# Patient Record
Sex: Male | Born: 1950 | Race: White | Hispanic: No | Marital: Married | Smoking: Former smoker
Health system: Southern US, Community
[De-identification: ages and names within clinical notes are randomized; demographics above are authoritative.]

## PROBLEM LIST (undated history)

## (undated) DIAGNOSIS — M1990 Unspecified osteoarthritis, unspecified site: Secondary | ICD-10-CM

## (undated) DIAGNOSIS — S0990XA Unspecified injury of head, initial encounter: Secondary | ICD-10-CM

## (undated) DIAGNOSIS — F32A Depression, unspecified: Secondary | ICD-10-CM

## (undated) HISTORY — PX: FOOT SURGERY: SHX648

## (undated) HISTORY — PX: BACK SURGERY: SHX140

## (undated) HISTORY — PX: FACIAL RECONSTRUCTION SURGERY: SHX631

---

## 2004-01-05 ENCOUNTER — Other Ambulatory Visit: Payer: Self-pay

## 2004-02-10 ENCOUNTER — Encounter: Admission: RE | Admit: 2004-02-10 | Discharge: 2004-05-10 | Payer: Self-pay | Admitting: Internal Medicine

## 2005-01-26 ENCOUNTER — Ambulatory Visit: Payer: Self-pay | Admitting: Gastroenterology

## 2005-03-20 ENCOUNTER — Emergency Department (HOSPITAL_COMMUNITY): Admission: EM | Admit: 2005-03-20 | Discharge: 2005-03-20 | Payer: Self-pay | Admitting: Emergency Medicine

## 2005-04-01 ENCOUNTER — Ambulatory Visit: Payer: Self-pay | Admitting: Internal Medicine

## 2006-02-10 ENCOUNTER — Encounter: Payer: Self-pay | Admitting: Otolaryngology

## 2006-03-03 ENCOUNTER — Ambulatory Visit: Payer: Self-pay | Admitting: Internal Medicine

## 2006-03-13 ENCOUNTER — Encounter: Payer: Self-pay | Admitting: Otolaryngology

## 2006-04-12 ENCOUNTER — Encounter: Payer: Self-pay | Admitting: Otolaryngology

## 2006-07-11 ENCOUNTER — Ambulatory Visit (HOSPITAL_COMMUNITY): Admission: RE | Admit: 2006-07-11 | Discharge: 2006-07-12 | Payer: Self-pay | Admitting: Neurosurgery

## 2006-08-23 ENCOUNTER — Encounter: Payer: Self-pay | Admitting: Neurosurgery

## 2006-09-12 ENCOUNTER — Encounter: Payer: Self-pay | Admitting: Neurosurgery

## 2006-09-19 ENCOUNTER — Ambulatory Visit: Payer: Self-pay | Admitting: Neurosurgery

## 2006-10-13 ENCOUNTER — Encounter: Payer: Self-pay | Admitting: Neurosurgery

## 2006-11-12 ENCOUNTER — Encounter: Payer: Self-pay | Admitting: Neurosurgery

## 2006-12-13 ENCOUNTER — Encounter: Payer: Self-pay | Admitting: Neurosurgery

## 2007-02-16 ENCOUNTER — Ambulatory Visit: Payer: Self-pay | Admitting: Otolaryngology

## 2007-03-30 ENCOUNTER — Ambulatory Visit: Payer: Self-pay | Admitting: Otolaryngology

## 2007-04-30 ENCOUNTER — Ambulatory Visit: Payer: Self-pay | Admitting: Otolaryngology

## 2007-07-27 ENCOUNTER — Encounter: Admission: RE | Admit: 2007-07-27 | Discharge: 2007-07-27 | Payer: Self-pay | Admitting: Family Medicine

## 2008-07-11 ENCOUNTER — Ambulatory Visit: Payer: Self-pay | Admitting: Unknown Physician Specialty

## 2009-12-30 ENCOUNTER — Ambulatory Visit: Payer: Self-pay | Admitting: Internal Medicine

## 2010-02-02 ENCOUNTER — Ambulatory Visit: Payer: Self-pay | Admitting: General Surgery

## 2010-02-09 ENCOUNTER — Ambulatory Visit: Payer: Self-pay | Admitting: General Surgery

## 2010-04-23 IMAGING — US ABDOMEN ULTRASOUND
1 series · 17 of 25 positions shown · non-contrast
Comparison: none

REASON FOR EXAM: abd pain generalized
COMMENTS:

[Series 1: abdomen ultrasound · 17 of 63 slices shown]
[im 1/63]
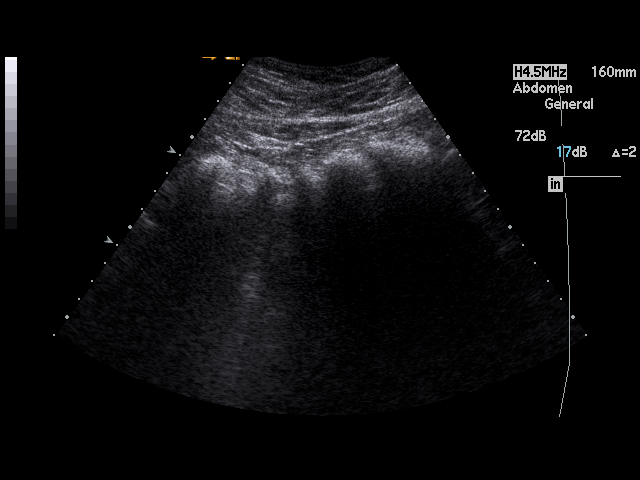
[im 6/63]
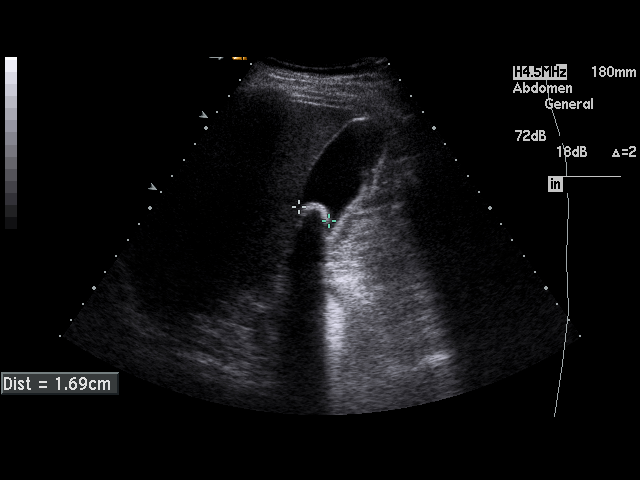
[im 8/63]
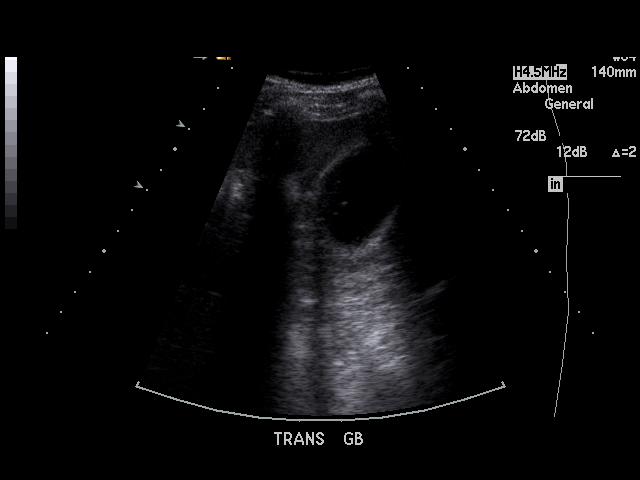
[im 13/63]
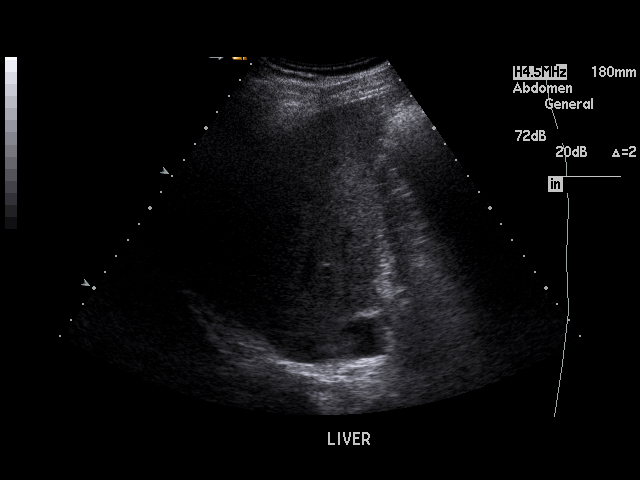
[im 16/63]
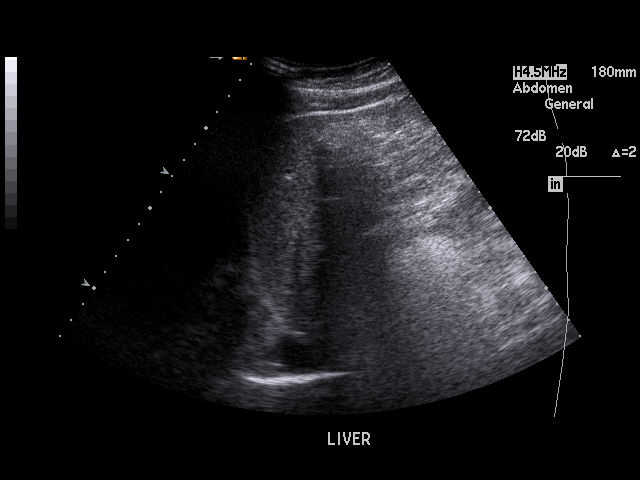
[im 21/63]
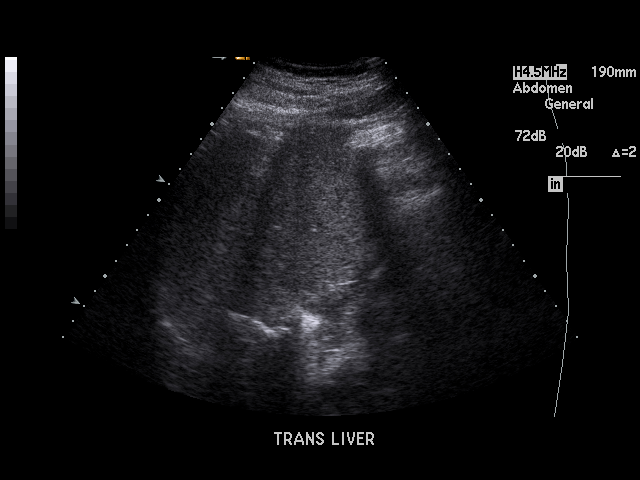
[im 24/63]
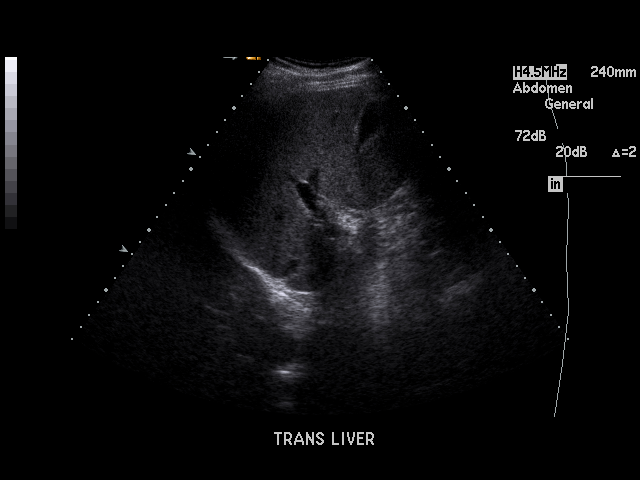
[im 29/63]
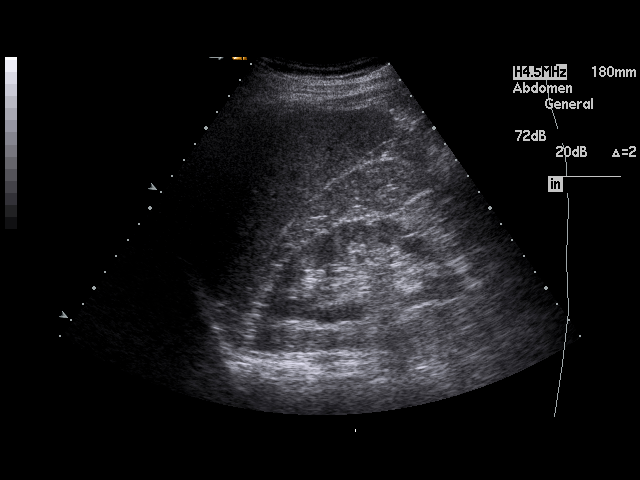
[im 32/63]
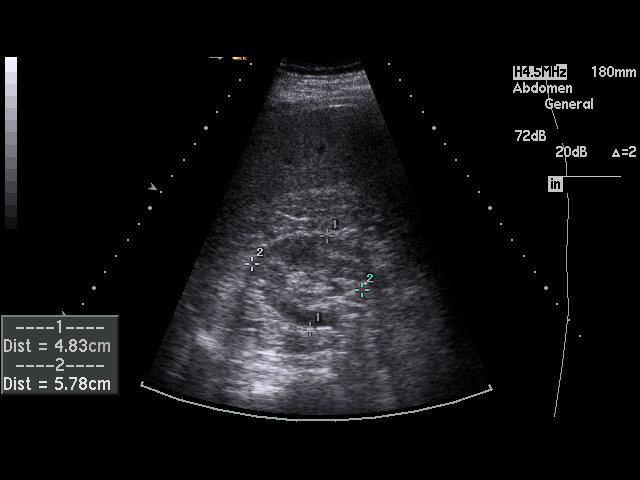
[im 34/63]
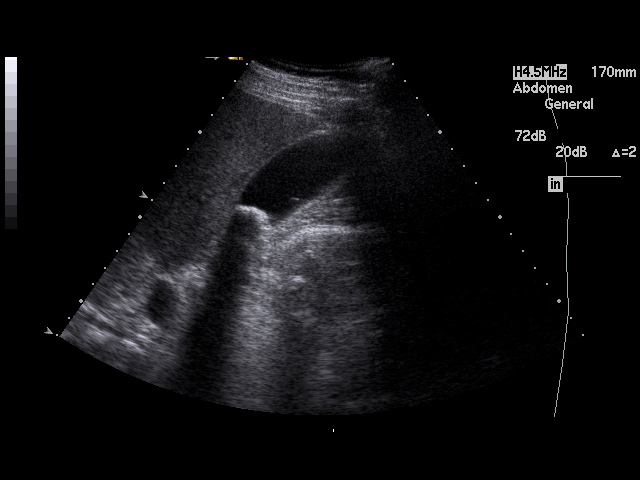
[im 39/63]
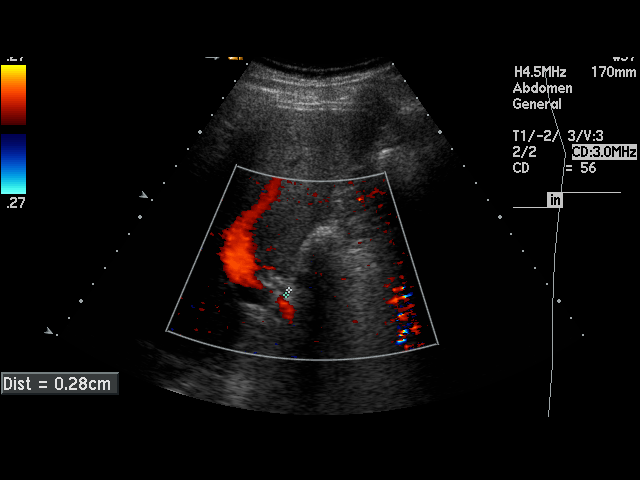
[im 42/63]
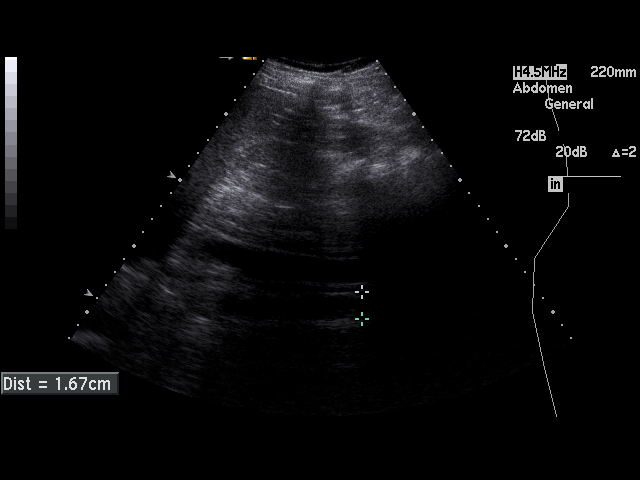
[im 47/63]
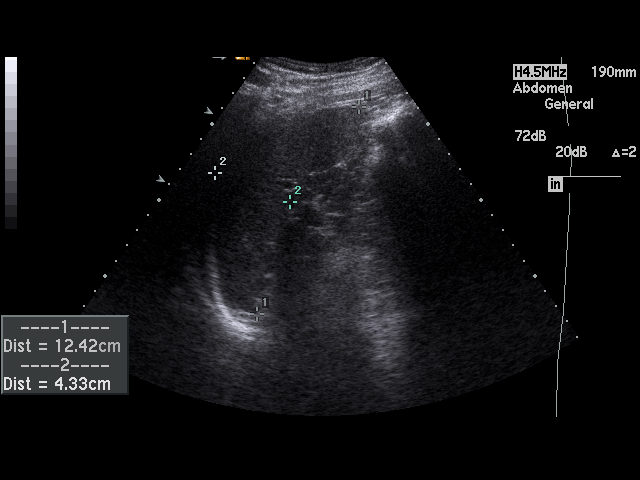
[im 50/63]
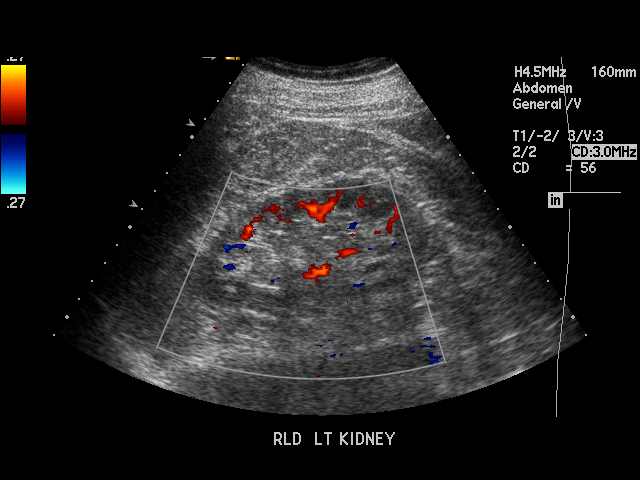
[im 55/63]
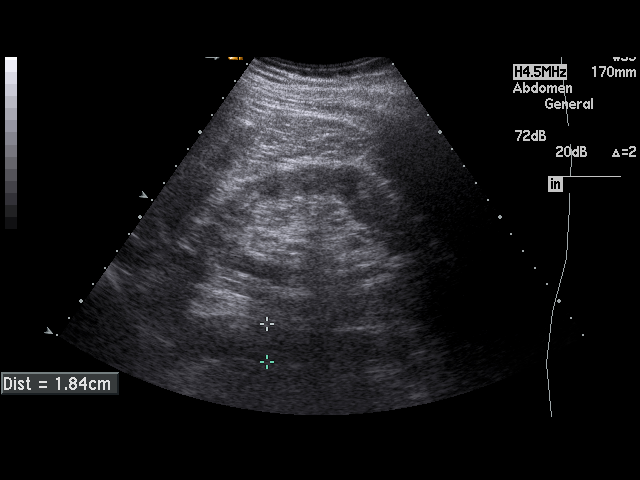
[im 57/63]
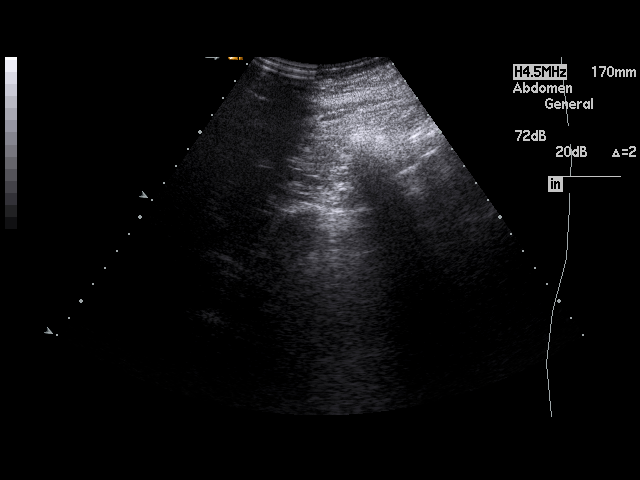
[im 63/63]
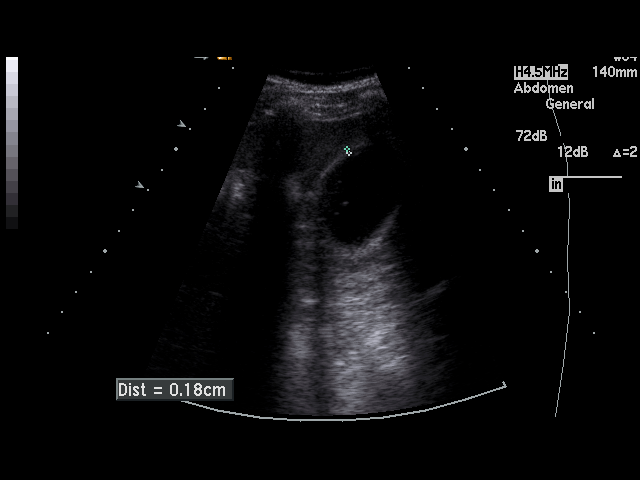

[17 of 25 positions shown; findings below may reference images not displayed]

PROCEDURE:     US  - US ABDOMEN GENERAL SURVEY  - December 30, 2009  [DATE]

RESULT:     The liver exhibits normal echotexture with no focal mass or
ductal dilation. Portal venous flow is normal in direction toward the liver.
The gallbladder is adequately distended and contains an echogenic shadowing
stone in the neck. It does not move over the course of the study. The
gallbladder wall is not thickened and there is no pericholecystic fluid or
positive sonographic Murphy's sign. The common bile duct is normal at 3.3 mm
in diameter.

The pancreas could not be demonstrated due to the presence of bowel gas. The
spleen, abdominal aorta, and kidneys are normal in appearance.
IMPRESSION: 1. There is at least one gallstone present. It appears impacted within the
gallbladder neck but the gallbladder wall is not thickened and there is no
pericholecystic fluid or positive sonographic Murphy's sign.
2. Otherwise the examination is within the limits of normal.

## 2011-04-30 NOTE — Op Note (Signed)
NAMEKENDREW, PACI                ACCOUNT NO.:  [id number]   MEDICAL RECORD NO.:  [id number]          PATIENT TYPE:  AMB   LOCATION:  SDS                          FACILITY:  [facility]   PHYSICIAN:  Henry A. Pool, M.D.    DATE OF BIRTH:  22-Feb-1951   DATE OF PROCEDURE:  07/11/2006  DATE OF DISCHARGE:                                 OPERATIVE REPORT   PREOPERATIVE DIAGNOSIS:  Right L5-S1 herniated nucleus pulposus with  radiculopathy.   POSTOPERATIVE DIAGNOSIS:  Right L5-S1 herniated nucleus pulposus with  radiculopathy.   PROCEDURE NAME:  Right L5-S1 laminotomy and microdiskectomy.   SURGEON:  Dr. Jordan Likes.   ASSISTANT:  Cheree Ditto.   ANESTHESIA:  General.   INDICATIONS:  Mr. Mckamie is a 60 year old male, history of back and right  lower extremity pain, failing conservative measures.  Workup demonstrates  evidence of significant right-sided L5-S1 disk herniation.  We discussed  options of operative management.  The patient decided proceed with right  side L5-S1 laminotomy microdiskectomy in hopes of improving his symptoms.   OPERATIVE NOTE:  Patient taken to the operating room table and placed under  general anesthesia.  After the patient was turned, he was prepped and draped  sterilely.  A 10-blade was used to make a linear skin incision over the L5-  S1 interspace.  This carried down sharply in midline.  Subperiosteal  dissection was then performed exposing the lamina and facet joints of what  was thought to be L5-S1 on the right side.  X-ray was taken.  Level was  found to be at the L4-5 level.  Dissection was redirected one level  caudally, and retractor was replaced.  Laminotomy then performed using high-  speed drill and Kerrison rongeurs to remove the inferior aspect of lamina of  L5 and medial aspect of L5-S1 facet joint and superior rim of the S1 lamina.  Ligamentum flavum was elevated and resected in piecemeal fashion using  Kerrison rongeurs.  The underlying thecal sac and  exiting S1 nerve root were  identified.  Microscope was brought to the field, using microdissection of  the right side S1 nerve root, and underlying disk herniation.  Epidural  venous plexus was coagulated and cut.  Thecal sac and S1 nerve were  mobilized and retracted towards midline.  Disk herniation was readily  apparent.  This incised 15-blade in a rectangular fashion.  A wide disk  space clean-out was then achieved, using pituitary rongeurs, up-angled  pituitary rongeurs and Epstein curettes.  Once the disk herniation was  completely resected, all loose or obvious degenerative disk material was  removed from the interspace.  After very thorough diskectomy had been  achieved, the canal was inspected.  There was no evidence of any residual  disk herniation.  There was no evidence of injury to thecal sac or nerve  roots.  Wound was then irrigated with antibiotic solution.  Gelfoam was  placed and operative hemostasis found to be good.  Microscope and  retraction system were removed.  Hemostasis in muscles was achieved with  electrocautery.  Wound was closed in layers  with Vicryl sutures.  Steri-  Strips and sterile dressings were applied.  There were no operative  complications.  The patient tolerated the procedure well and he returns to  recovery room postoperatively.           ______________________________  Kathaleen Maser Pool, M.D.     HAP/MEDQ  D:  07/11/2006  T:  07/11/2006  Job:  [id number]

## 2014-03-28 ENCOUNTER — Ambulatory Visit: Payer: Self-pay | Admitting: Unknown Physician Specialty

## 2014-04-01 LAB — PATHOLOGY REPORT

## 2016-02-12 ENCOUNTER — Ambulatory Visit: Payer: Self-pay | Admitting: Family

## 2016-02-12 VITALS — BP 140/90 | HR 91 | Temp 98.4°F

## 2016-02-12 DIAGNOSIS — J101 Influenza due to other identified influenza virus with other respiratory manifestations: Secondary | ICD-10-CM

## 2016-02-12 DIAGNOSIS — R509 Fever, unspecified: Secondary | ICD-10-CM

## 2016-02-13 NOTE — Progress Notes (Signed)
See note in paper chart by Heather Ratcliffe, PAC  

## 2016-03-01 ENCOUNTER — Other Ambulatory Visit: Payer: Self-pay | Admitting: Physician Assistant

## 2016-03-01 DIAGNOSIS — Z299 Encounter for prophylactic measures, unspecified: Secondary | ICD-10-CM

## 2016-03-01 NOTE — Progress Notes (Signed)
Patient came in to have blood drawn per Dr. Arlana Pouchate.  Blood was drawn from the right arm without any incident. Patient wants a copy mailed to his attention and a copy faxed to Dr. Arlana Pouchate.

## 2016-03-02 LAB — CMP12+LP+TP+TSH+6AC+PSA+CBC…
ALT: 18 IU/L (ref 0–44)
AST: 19 IU/L (ref 0–40)
Albumin/Globulin Ratio: 1.9 (ref 1.2–2.2)
Albumin: 4.3 g/dL (ref 3.6–4.8)
Alkaline Phosphatase: 69 IU/L (ref 39–117)
BUN/Creatinine Ratio: 14 (ref 10–22)
BUN: 15 mg/dL (ref 8–27)
Basophils Absolute: 0 x10E3/uL (ref 0.0–0.2)
Basos: 0 %
Bilirubin Total: 0.5 mg/dL (ref 0.0–1.2)
Calcium: 9.7 mg/dL (ref 8.6–10.2)
Chloride: 103 mmol/L (ref 96–106)
Chol/HDL Ratio: 3.9 ratio (ref 0.0–5.0)
Cholesterol, Total: 236 mg/dL — ABNORMAL HIGH (ref 100–199)
Creatinine, Ser: 1.11 mg/dL (ref 0.76–1.27)
EOS (ABSOLUTE): 0.2 x10E3/uL (ref 0.0–0.4)
Eos: 3 %
Estimated CHD Risk: 0.7 " times avg." (ref 0.0–1.0)
Free Thyroxine Index: 2.1 (ref 1.2–4.9)
GFR calc Af Amer: 82 mL/min/1.73
GFR calc non Af Amer: 71 mL/min/1.73
GGT: 40 IU/L (ref 0–65)
Globulin, Total: 2.3 g/dL (ref 1.5–4.5)
Glucose: 123 mg/dL — ABNORMAL HIGH (ref 65–99)
HDL: 60 mg/dL
Hematocrit: 44.9 % (ref 37.5–51.0)
Hemoglobin: 15.2 g/dL (ref 12.6–17.7)
Immature Grans (Abs): 0 x10E3/uL (ref 0.0–0.1)
Immature Granulocytes: 0 %
Iron: 88 ug/dL (ref 38–169)
LDH: 193 IU/L (ref 121–224)
LDL Calculated: 139 mg/dL — ABNORMAL HIGH (ref 0–99)
Lymphocytes Absolute: 1.5 x10E3/uL (ref 0.7–3.1)
Lymphs: 32 %
MCH: 30.8 pg (ref 26.6–33.0)
MCHC: 33.9 g/dL (ref 31.5–35.7)
MCV: 91 fL (ref 79–97)
Monocytes Absolute: 0.5 x10E3/uL (ref 0.1–0.9)
Monocytes: 10 %
Neutrophils Absolute: 2.6 x10E3/uL (ref 1.4–7.0)
Neutrophils: 55 %
Phosphorus: 2.7 mg/dL (ref 2.5–4.5)
Platelets: 207 x10E3/uL (ref 150–379)
Potassium: 4.9 mmol/L (ref 3.5–5.2)
Prostate Specific Ag, Serum: 2.9 ng/mL (ref 0.0–4.0)
RBC: 4.93 x10E6/uL (ref 4.14–5.80)
RDW: 13.4 % (ref 12.3–15.4)
Sodium: 142 mmol/L (ref 134–144)
T3 Uptake Ratio: 25 % (ref 24–39)
T4, Total: 8.5 ug/dL (ref 4.5–12.0)
TSH: 2.05 u[IU]/mL (ref 0.450–4.500)
Total Protein: 6.6 g/dL (ref 6.0–8.5)
Triglycerides: 183 mg/dL — ABNORMAL HIGH (ref 0–149)
Uric Acid: 7.2 mg/dL (ref 3.7–8.6)
VLDL Cholesterol Cal: 37 mg/dL (ref 5–40)
WBC: 4.7 x10E3/uL (ref 3.4–10.8)

## 2016-03-02 LAB — HGB A1C W/O EAG: Hgb A1c MFr Bld: 5.7 % — ABNORMAL HIGH (ref 4.8–5.6)

## 2016-03-03 NOTE — Progress Notes (Signed)
Lab results were faxed to Dr. Arlana Pouchate and a copy was mailed to the patient per his request.

## 2016-09-01 ENCOUNTER — Ambulatory Visit: Payer: Self-pay | Admitting: Physician Assistant

## 2016-09-01 ENCOUNTER — Encounter (INDEPENDENT_AMBULATORY_CARE_PROVIDER_SITE_OTHER): Payer: Self-pay

## 2016-09-01 DIAGNOSIS — Z299 Encounter for prophylactic measures, unspecified: Secondary | ICD-10-CM

## 2016-09-01 NOTE — Progress Notes (Signed)
Patient came in to have blood drawn for testing per Dr. Maree Krabbeate's authorization.  Patient wants lab results mailed to him when the results are finalized and he also wants a copy faxed to Dr. Arlana Pouchate.

## 2016-09-02 LAB — COMPREHENSIVE METABOLIC PANEL
ALBUMIN: 4.4 g/dL (ref 3.6–4.8)
ALK PHOS: 77 IU/L (ref 39–117)
ALT: 20 IU/L (ref 0–44)
AST: 22 IU/L (ref 0–40)
Albumin/Globulin Ratio: 1.8 (ref 1.2–2.2)
BILIRUBIN TOTAL: 0.6 mg/dL (ref 0.0–1.2)
BUN / CREAT RATIO: 15 (ref 10–24)
BUN: 16 mg/dL (ref 8–27)
CHLORIDE: 101 mmol/L (ref 96–106)
CO2: 26 mmol/L (ref 18–29)
CREATININE: 1.1 mg/dL (ref 0.76–1.27)
Calcium: 9.6 mg/dL (ref 8.6–10.2)
GFR calc Af Amer: 82 mL/min/{1.73_m2} (ref >59)
GFR calc non Af Amer: 71 mL/min/{1.73_m2} (ref >59)
GLOBULIN, TOTAL: 2.5 g/dL (ref 1.5–4.5)
GLUCOSE: 101 mg/dL — AB (ref 65–99)
Potassium: 5.3 mmol/L — ABNORMAL HIGH (ref 3.5–5.2)
SODIUM: 142 mmol/L (ref 134–144)
Total Protein: 6.9 g/dL (ref 6.0–8.5)

## 2016-09-02 LAB — LIPID PANEL
CHOLESTEROL TOTAL: 248 mg/dL — AB (ref 100–199)
Chol/HDL Ratio: 3.6 ratio units (ref 0.0–5.0)
HDL: 69 mg/dL (ref >39)
LDL CALC: 152 mg/dL — AB (ref 0–99)
Triglycerides: 136 mg/dL (ref 0–149)
VLDL CHOLESTEROL CAL: 27 mg/dL (ref 5–40)

## 2016-09-02 LAB — HGB A1C W/O EAG: HEMOGLOBIN A1C: 5.4 % (ref 4.8–5.6)

## 2016-10-07 ENCOUNTER — Encounter: Payer: Self-pay | Admitting: Physician Assistant

## 2016-10-07 ENCOUNTER — Ambulatory Visit: Payer: Self-pay | Admitting: Physician Assistant

## 2016-10-07 VITALS — BP 135/80 | HR 71 | Temp 97.9°F

## 2016-10-07 DIAGNOSIS — J0120 Acute ethmoidal sinusitis, unspecified: Secondary | ICD-10-CM

## 2016-10-07 NOTE — Progress Notes (Signed)
   Subjective:    Patient ID: Michael Gibson, male    DOB: May 18, 1951, 65 y.o.   MRN: [id number]  HPI Patient c/o sinus congestion, left ear pressure, and post nasal drainage for one week. Denies fever/chills or N/V/D. States using Flonase for allergies.   Review of Systems Negative except for compliant.    Objective:   Physical Exam HEENT for left maxillary sinus guarding, edematous nasal turbinates, and post nasal drainage. Neck supple without adenopathy. Lungs CTA, and Heart RRR.       Assessment & Plan:Sinusitis  Continue flonase. Start Amoxil and Allergra-D as directed. Follow up with PCP if no improvement in one week.

## 2017-03-14 ENCOUNTER — Other Ambulatory Visit: Payer: Self-pay

## 2017-03-14 DIAGNOSIS — Z299 Encounter for prophylactic measures, unspecified: Secondary | ICD-10-CM

## 2017-03-14 NOTE — Progress Notes (Signed)
Patient came in to have blood drawn for testing per Dr. Tate's orders. 

## 2017-03-15 LAB — COMPREHENSIVE METABOLIC PANEL
A/G RATIO: 1.9 (ref 1.2–2.2)
ALT: 19 IU/L (ref 0–44)
AST: 18 IU/L (ref 0–40)
Albumin: 4.3 g/dL (ref 3.6–4.8)
Alkaline Phosphatase: 67 IU/L (ref 39–117)
BILIRUBIN TOTAL: 0.3 mg/dL (ref 0.0–1.2)
BUN/Creatinine Ratio: 15 (ref 10–24)
BUN: 17 mg/dL (ref 8–27)
CALCIUM: 9.7 mg/dL (ref 8.6–10.2)
CHLORIDE: 102 mmol/L (ref 96–106)
CO2: 26 mmol/L (ref 18–29)
Creatinine, Ser: 1.17 mg/dL (ref 0.76–1.27)
GFR calc Af Amer: 76 mL/min/{1.73_m2} (ref >59)
GFR, EST NON AFRICAN AMERICAN: 66 mL/min/{1.73_m2} (ref >59)
GLOBULIN, TOTAL: 2.3 g/dL (ref 1.5–4.5)
Glucose: 112 mg/dL — ABNORMAL HIGH (ref 65–99)
POTASSIUM: 5.4 mmol/L — AB (ref 3.5–5.2)
SODIUM: 143 mmol/L (ref 134–144)
Total Protein: 6.6 g/dL (ref 6.0–8.5)

## 2017-03-15 LAB — LIPID PANEL
CHOLESTEROL TOTAL: 239 mg/dL — AB (ref 100–199)
Chol/HDL Ratio: 3.9 ratio (ref 0.0–5.0)
HDL: 61 mg/dL (ref >39)
LDL CALC: 146 mg/dL — AB (ref 0–99)
TRIGLYCERIDES: 161 mg/dL — AB (ref 0–149)
VLDL CHOLESTEROL CAL: 32 mg/dL (ref 5–40)

## 2017-03-15 LAB — SPECIMEN STATUS

## 2017-09-14 ENCOUNTER — Other Ambulatory Visit: Payer: Self-pay

## 2017-09-14 DIAGNOSIS — Z299 Encounter for prophylactic measures, unspecified: Secondary | ICD-10-CM

## 2017-09-14 NOTE — Progress Notes (Signed)
Patient came in to have blood drawn for testing per dr. Maree Krabbe orders.

## 2017-09-14 NOTE — Addendum Note (Signed)
Addended by: Catha Brow T on: 09/14/2017 03:28 PM   Modules accepted: Orders

## 2017-09-14 NOTE — Addendum Note (Signed)
Addended by: Catha Brow T on: 09/14/2017 03:17 PM   Modules accepted: Orders

## 2017-09-15 LAB — COMPREHENSIVE METABOLIC PANEL
ALBUMIN: 4.4 g/dL (ref 3.6–4.8)
ALK PHOS: 74 IU/L (ref 39–117)
ALT: 21 IU/L (ref 0–44)
AST: 24 IU/L (ref 0–40)
Albumin/Globulin Ratio: 1.8 (ref 1.2–2.2)
BILIRUBIN TOTAL: 0.4 mg/dL (ref 0.0–1.2)
BUN / CREAT RATIO: 12 (ref 10–24)
BUN: 14 mg/dL (ref 8–27)
CHLORIDE: 103 mmol/L (ref 96–106)
CO2: 24 mmol/L (ref 20–29)
Calcium: 9.7 mg/dL (ref 8.6–10.2)
Creatinine, Ser: 1.2 mg/dL (ref 0.76–1.27)
GFR calc Af Amer: 73 mL/min/{1.73_m2} (ref >59)
GFR calc non Af Amer: 64 mL/min/{1.73_m2} (ref >59)
GLOBULIN, TOTAL: 2.4 g/dL (ref 1.5–4.5)
GLUCOSE: 109 mg/dL — AB (ref 65–99)
Potassium: 4.8 mmol/L (ref 3.5–5.2)
SODIUM: 142 mmol/L (ref 134–144)
Total Protein: 6.8 g/dL (ref 6.0–8.5)

## 2017-09-15 LAB — LIPID PANEL
CHOLESTEROL TOTAL: 203 mg/dL — AB (ref 100–199)
Chol/HDL Ratio: 3.2 ratio (ref 0.0–5.0)
HDL: 63 mg/dL (ref >39)
LDL Calculated: 117 mg/dL — ABNORMAL HIGH (ref 0–99)
Triglycerides: 113 mg/dL (ref 0–149)
VLDL Cholesterol Cal: 23 mg/dL (ref 5–40)

## 2017-09-15 LAB — HGB A1C W/O EAG: Hgb A1c MFr Bld: 5.5 % (ref 4.8–5.6)

## 2018-03-13 ENCOUNTER — Other Ambulatory Visit: Payer: Self-pay

## 2018-03-13 DIAGNOSIS — E785 Hyperlipidemia, unspecified: Secondary | ICD-10-CM

## 2018-03-13 DIAGNOSIS — Z125 Encounter for screening for malignant neoplasm of prostate: Secondary | ICD-10-CM

## 2018-03-13 DIAGNOSIS — R7309 Other abnormal glucose: Secondary | ICD-10-CM

## 2018-03-13 DIAGNOSIS — I10 Essential (primary) hypertension: Secondary | ICD-10-CM

## 2018-03-14 LAB — LIPID PANEL
Chol/HDL Ratio: 4 ratio (ref 0.0–5.0)
Cholesterol, Total: 238 mg/dL — ABNORMAL HIGH (ref 100–199)
HDL: 60 mg/dL (ref >39)
LDL Calculated: 147 mg/dL — ABNORMAL HIGH (ref 0–99)
TRIGLYCERIDES: 153 mg/dL — AB (ref 0–149)
VLDL CHOLESTEROL CAL: 31 mg/dL (ref 5–40)

## 2018-03-14 LAB — COMPREHENSIVE METABOLIC PANEL
ALT: 23 IU/L (ref 0–44)
AST: 18 IU/L (ref 0–40)
Albumin/Globulin Ratio: 1.9 (ref 1.2–2.2)
Albumin: 4.4 g/dL (ref 3.6–4.8)
Alkaline Phosphatase: 68 IU/L (ref 39–117)
BILIRUBIN TOTAL: 0.4 mg/dL (ref 0.0–1.2)
BUN/Creatinine Ratio: 12 (ref 10–24)
BUN: 14 mg/dL (ref 8–27)
CALCIUM: 9.3 mg/dL (ref 8.6–10.2)
CHLORIDE: 105 mmol/L (ref 96–106)
CO2: 20 mmol/L (ref 20–29)
Creatinine, Ser: 1.18 mg/dL (ref 0.76–1.27)
GFR calc non Af Amer: 65 mL/min/{1.73_m2} (ref >59)
GFR, EST AFRICAN AMERICAN: 75 mL/min/{1.73_m2} (ref >59)
GLUCOSE: 105 mg/dL — AB (ref 65–99)
Globulin, Total: 2.3 g/dL (ref 1.5–4.5)
Potassium: 5 mmol/L (ref 3.5–5.2)
Sodium: 147 mmol/L — ABNORMAL HIGH (ref 134–144)
Total Protein: 6.7 g/dL (ref 6.0–8.5)

## 2018-03-14 LAB — PSA: Prostate Specific Ag, Serum: 1.2 ng/mL (ref 0.0–4.0)

## 2018-03-14 LAB — HGB A1C W/O EAG: Hgb A1c MFr Bld: 5.5 % (ref 4.8–5.6)

## 2018-03-27 ENCOUNTER — Other Ambulatory Visit: Payer: Self-pay

## 2018-03-27 DIAGNOSIS — R531 Weakness: Secondary | ICD-10-CM

## 2018-03-27 DIAGNOSIS — E785 Hyperlipidemia, unspecified: Secondary | ICD-10-CM

## 2018-03-27 DIAGNOSIS — I10 Essential (primary) hypertension: Secondary | ICD-10-CM

## 2018-03-27 DIAGNOSIS — Z299 Encounter for prophylactic measures, unspecified: Secondary | ICD-10-CM

## 2018-03-28 LAB — CBC WITH DIFFERENTIAL/PLATELET
Basophils Absolute: 0 x10E3/uL (ref 0.0–0.2)
Basos: 0 %
EOS (ABSOLUTE): 0.1 x10E3/uL (ref 0.0–0.4)
Eos: 3 %
Hematocrit: 45.8 % (ref 37.5–51.0)
Hemoglobin: 15.3 g/dL (ref 13.0–17.7)
Immature Grans (Abs): 0 x10E3/uL (ref 0.0–0.1)
Immature Granulocytes: 0 %
Lymphocytes Absolute: 1.5 x10E3/uL (ref 0.7–3.1)
Lymphs: 38 %
MCH: 30.8 pg (ref 26.6–33.0)
MCHC: 33.4 g/dL (ref 31.5–35.7)
MCV: 92 fL (ref 79–97)
Monocytes Absolute: 0.5 x10E3/uL (ref 0.1–0.9)
Monocytes: 13 %
Neutrophils Absolute: 1.8 x10E3/uL (ref 1.4–7.0)
Neutrophils: 46 %
Platelets: 249 x10E3/uL (ref 150–379)
RBC: 4.96 x10E6/uL (ref 4.14–5.80)
RDW: 13.7 % (ref 12.3–15.4)
WBC: 3.9 x10E3/uL (ref 3.4–10.8)

## 2018-03-28 LAB — TSH: TSH: 3.24 u[IU]/mL (ref 0.450–4.500)

## 2018-03-28 LAB — TESTOSTERONE,FREE AND TOTAL
Testosterone, Free: 6.9 pg/mL (ref 6.6–18.1)
Testosterone: 541 ng/dL (ref 264–916)

## 2018-03-28 LAB — HEPATITIS C ANTIBODY (REFLEX): HCV Ab: <0.1 s/co ratio (ref 0.0–0.9)

## 2018-03-28 LAB — HCV COMMENT:

## 2020-01-12 ENCOUNTER — Ambulatory Visit: Payer: Medicare Other

## 2020-01-17 ENCOUNTER — Ambulatory Visit: Payer: Medicare Other

## 2020-01-23 ENCOUNTER — Ambulatory Visit: Payer: Medicare Other

## 2020-04-15 ENCOUNTER — Ambulatory Visit (INDEPENDENT_AMBULATORY_CARE_PROVIDER_SITE_OTHER): Payer: Medicare HMO

## 2020-04-15 ENCOUNTER — Ambulatory Visit (INDEPENDENT_AMBULATORY_CARE_PROVIDER_SITE_OTHER): Payer: Medicare HMO | Admitting: Family Medicine

## 2020-04-15 ENCOUNTER — Encounter: Payer: Self-pay | Admitting: Family Medicine

## 2020-04-15 ENCOUNTER — Other Ambulatory Visit: Payer: Self-pay

## 2020-04-15 DIAGNOSIS — M25571 Pain in right ankle and joints of right foot: Secondary | ICD-10-CM

## 2020-04-15 DIAGNOSIS — M25561 Pain in right knee: Secondary | ICD-10-CM

## 2020-04-15 NOTE — Progress Notes (Addendum)
Michael Gibson - 69 y.o. male MRN [id number]  Date of birth: Jul 03, 1951  Office Visit Note: Visit Date: 04/15/2020 PCP: Albina Billet, MD Referred by: No ref. provider found  Subjective: Chief Complaint  Patient presents with  . Right Ankle - Pain    Rolled ankle and fell 2 weeks ago. Still having as much pain today as when he injured it. Swelling is down some. Wearing an ankle sleeve - helps some.   . 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 H/o cortisone injection years ago - helped.   HPI: Michael Gibson is a 69 y.o. male who comes in today with right ankle and right knee pain.  He reports that he twisted his ankle two weeks ago when stepping out of his truck. He says that he inverted his foot and fell out of the truck onto his right side. He immediately had lateral ankle pain and swelling. He has been icing and wearing a brace with no improvement in pain. 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  Right knee pain- chronic knee pain, arthritis. 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 Describes pain as a general dull ache. Occasionally feels clicking in his knee. No locking/catching/ or knee giving out.   ROS Otherwise per HPI.  Assessment & Plan: Visit Diagnoses:  1. Pain in right ankle and joints of right foot   2. Acute pain of right knee     Plan:  Right ankle pain- no acute fracture seen on x-ray. Likely injury to peroneal tendons with inversion. Provided ankle rehabilitation exercises and ASO brace for increased stability.  Right knee pain with known arthritis- patient elected for a corticosteroid injection today.  Meds & Orders: No orders of the defined types were placed in this encounter.   Orders Placed This Encounter  Procedures  . XR Ankle Complete Right    Follow-up: No follow-ups on file.   Procedures: Procedure  performed: knee intraarticular corticosteroid injection  Noted no overlying erythema, induration, or other signs of local infection. The right superior-lateral patellar joint space was palpated and marked. The overlying skin was prepped in a sterile fashion. Topical analgesic spray: Ethyl chloride. Joint: right knee Needle: 25 gauge, 1.5 inch Completed without difficulty. Meds: 40 mg methylrprednisolone, 4 ml 1% lidocaine without epinephrine    Clinical History: No specialty comments available.   He reports that he has quit smoking. He has never used smokeless tobacco. No results for input(s): HGBA1C, LABURIC in the last 8760 hours.  Objective:  VS:  HT:    WT:   BMI:     BP:   HR: bpm  TEMP: ( )  RESP:  Physical Exam  PHYSICAL EXAM: Gen: NAD, alert, cooperative with exam, well-appearing HEENT: clear conjunctiva,  CV:  no edema, capillary refill brisk, normal rate Resp: non-labored Skin: no rashes, normal turgor  Neuro: no gross deficits.  Psych:  alert and oriented  Ortho Exam  Right ankle: - Inspection: Mild swelling over lateral ankle - Palpation: TTP over posterior lateral malleolus No sign of peroneal tendon subluxation or TTP. - Strength: Normal strength with dorsiflexion, plantarflexion, inversion, and eversion of foot; flexion and extension of toes - ROM: Full ROM - Neuro/vasc: NV intact - Special Tests: 1+ laxity with anterior drawer, no subluxation of peroneal  tendons with inversion test but painful  Knee: - Inspection: 1+ effusion - Palpation: TTP over medial and lateral joint lines - ROM: full active ROM with flexion and extension in knee and hip, crepitus and clicking appreciated - Strength: 5/5 strength - Neuro/vasc: NV intact Ligaments appear intact.  Imaging: No results found.  Past Medical/Family/Surgical/Social History: Medications & Allergies reviewed per EMR, new medications updated. There are no problems to display for this  patient.  History reviewed. No pertinent past medical history. History reviewed. No pertinent family history. History reviewed. No pertinent surgical history. Social History   Occupational History  . Not on file  Tobacco Use  . Smoking status: Former Games developer  . Smokeless tobacco: Never Used  Substance and Sexual Activity  . Alcohol use: Not on file  . Drug use: Not on file  . Sexual activity: Not on file

## 2020-04-15 NOTE — Progress Notes (Signed)
I saw and examined the patient with Dr. Robby Sermon and agree with assessment and plan as outlined.    Right ankle sprain 2 weeks ago.  X-Rays negative today.  ASO brace, home exercises.  Right knee effusion with probable DJD.  Steroid injection today.   Return as needed.

## 2022-05-24 ENCOUNTER — Encounter: Payer: Self-pay | Admitting: *Deleted

## 2022-05-25 ENCOUNTER — Ambulatory Visit
Admission: RE | Admit: 2022-05-25 | Discharge: 2022-05-25 | Disposition: A | Discharge status: Home / Self Care | Payer: Medicare HMO | Attending: Gastroenterology | Admitting: Gastroenterology

## 2022-05-25 ENCOUNTER — Ambulatory Visit: Payer: Medicare HMO | Admitting: Certified Registered"

## 2022-05-25 ENCOUNTER — Encounter
Admission: RE | Disposition: A | Discharge status: Home / Self Care | Payer: Self-pay | Source: Home / Self Care | Attending: Gastroenterology

## 2022-05-25 ENCOUNTER — Encounter: Payer: Self-pay | Admitting: *Deleted

## 2022-05-25 DIAGNOSIS — Z87891 Personal history of nicotine dependence: Secondary | ICD-10-CM | POA: Diagnosis not present

## 2022-05-25 DIAGNOSIS — K635 Polyp of colon: Secondary | ICD-10-CM | POA: Insufficient documentation

## 2022-05-25 DIAGNOSIS — K5730 Diverticulosis of large intestine without perforation or abscess without bleeding: Secondary | ICD-10-CM | POA: Insufficient documentation

## 2022-05-25 DIAGNOSIS — K641 Second degree hemorrhoids: Secondary | ICD-10-CM | POA: Diagnosis not present

## 2022-05-25 DIAGNOSIS — F32A Depression, unspecified: Secondary | ICD-10-CM | POA: Diagnosis not present

## 2022-05-25 DIAGNOSIS — Z1211 Encounter for screening for malignant neoplasm of colon: Secondary | ICD-10-CM | POA: Diagnosis present

## 2022-05-25 HISTORY — DX: Unspecified osteoarthritis, unspecified site: M19.90

## 2022-05-25 HISTORY — DX: Depression, unspecified: F32.A

## 2022-05-25 HISTORY — DX: Unspecified injury of head, initial encounter: S09.90XA

## 2022-05-25 HISTORY — PX: COLONOSCOPY WITH PROPOFOL: SHX5780

## 2022-05-25 SURGERY — COLONOSCOPY WITH PROPOFOL
Anesthesia: General

## 2022-05-25 MED ORDER — SODIUM CHLORIDE 0.9 % IV SOLN: INTRAVENOUS | Status: DC

## 2022-05-25 MED ORDER — PROPOFOL 10 MG/ML IV BOLUS
INTRAVENOUS | Status: DC | PRN
  Administered 2022-05-25: 100 mg via INTRAVENOUS

## 2022-05-25 MED ORDER — STERILE WATER FOR IRRIGATION IR SOLN
Status: DC | PRN
  Administered 2022-05-25: 50 mL

## 2022-05-25 MED ORDER — PROPOFOL 500 MG/50ML IV EMUL
INTRAVENOUS | Status: DC | PRN
  Administered 2022-05-25: 140 ug/kg/min via INTRAVENOUS

## 2022-05-25 NOTE — Anesthesia Postprocedure Evaluation (Signed)
Anesthesia Post Note  Patient: Michael Gibson.  Procedure(s) Performed: COLONOSCOPY WITH PROPOFOL  Patient location during evaluation: PACU Anesthesia Type: General Level of consciousness: awake and oriented Pain management: pain level controlled Vital Signs Assessment: post-procedure vital signs reviewed and stable Respiratory status: spontaneous breathing and respiratory function stable Cardiovascular status: stable Anesthetic complications: no   No notable events documented.   Last Vitals:  Vitals:   05/25/22 0952 05/25/22 1011  BP:  135/74  Pulse: 62 (!) 59  Resp: 17 18  Temp:    SpO2: 100% 98%    Last Pain:  Vitals:   05/25/22 1011  TempSrc:   PainSc: 0-No pain                 VAN STAVEREN,Grayling Schranz

## 2022-05-25 NOTE — H&P (Signed)
Outpatient short stay form Pre-procedure 05/25/2022  Regis Bill, MD  Primary Physician: Jaclyn Shaggy, MD  Reason for visit:  Surveillance colonoscopy  History of present illness:    71 y/o gentleman with history of colon polyps with last colonoscopy in 2015 here for surveillance colonoscopy. No blood thinners. No family history of GI malignancies. No significant abdominal surgeries.    Current Facility-Administered Medications:    0.9 %  sodium chloride infusion, , Intravenous, Continuous, Kayle Correa, Rossie Muskrat, MD, Last Rate: 20 mL/hr at 05/25/22 0913, New Bag at 05/25/22 0913  Medications Prior to Admission  Medication Sig Dispense Refill Last Dose   buPROPion (WELLBUTRIN XL) 150 MG 24 hr tablet Take by mouth.   05/24/2022   gabapentin (NEURONTIN) 600 MG tablet Take 600 mg by mouth 2 (two) times daily.   05/24/2022   traZODone (DESYREL) 50 MG tablet Take by mouth.   Past Week   DIAZEPAM PO Take by mouth. (Patient not taking: Reported on 05/25/2022)   Not Taking     No Known Allergies   Past Medical History:  Diagnosis Date   Arthritis    Closed head injury    Depression     Review of systems:  Otherwise negative.    Physical Exam  Gen: Alert, oriented. Appears stated age.  HEENT: PERRLA. Lungs: No respiratory distress CV: RRR Abd: soft, benign, no masses Ext: No edema    Planned procedures: Proceed with colonoscopy. The patient understands the nature of the planned procedure, indications, risks, alternatives and potential complications including but not limited to bleeding, infection, perforation, damage to internal organs and possible oversedation/side effects from anesthesia. The patient agrees and gives consent to proceed.  Please refer to procedure notes for findings, recommendations and patient disposition/instructions.     Regis Bill, MD [facility]

## 2022-05-25 NOTE — Op Note (Signed)
[name] Procedure Date: 05/25/2022 9:02 AM MRN: [id number] Account #: [id number] Date of Birth: [birth year] Admit Type: Outpatient Age: 71 Room: [facility] ENDO ROOM 1 Gender: Male Note Status: Finalized Instrument Name: Jasper Riling [id number] Procedure:             Colonoscopy Indications:           Surveillance: Personal history of adenomatous polyps                         on last colonoscopy > 5 years ago Providers:             Andrey Farmer MD, MD Medicines:             Monitored Anesthesia Care Complications:         No immediate complications. Estimated blood loss:                         Minimal. Procedure:             Pre-Anesthesia Assessment:                        - Prior to the procedure, a History and Physical was                         performed, and patient medications and allergies were                         reviewed. The patient is competent. The risks and                         benefits of the procedure and the sedation options and                         risks were discussed with the patient. All questions                         were answered and informed consent was obtained.                         Patient identification and proposed procedure were                         verified by the physician, the nurse, the                         anesthesiologist, the anesthetist and the technician                         in the endoscopy suite. Mental Status Examination:                         alert and oriented. Airway Examination: normal                         oropharyngeal airway and neck mobility. Respiratory                         Examination: clear to auscultation. CV Examination:  normal. Prophylactic Antibiotics: The patient does not                         require prophylactic antibiotics. Prior                         Anticoagulants: The patient has taken no previous                          anticoagulant or antiplatelet agents. ASA Grade                         Assessment: II - A patient with mild systemic disease.                         After reviewing the risks and benefits, the patient                         was deemed in satisfactory condition to undergo the                         procedure. The anesthesia plan was to use monitored                         anesthesia care (MAC). Immediately prior to                         administration of medications, the patient was                         re-assessed for adequacy to receive sedatives. The                         heart rate, respiratory rate, oxygen saturations,                         blood pressure, adequacy of pulmonary ventilation, and                         response to care were monitored throughout the                         procedure. The physical status of the patient was                         re-assessed after the procedure.                        After obtaining informed consent, the colonoscope was                         passed under direct vision. Throughout the procedure,                         the patient's blood pressure, pulse, and oxygen                         saturations were monitored continuously. The  Colonoscope was introduced through the anus and                         advanced to the the cecum, identified by appendiceal                         orifice and ileocecal valve. The colonoscopy was                         performed without difficulty. The patient tolerated                         the procedure well. The quality of the bowel                         preparation was good. Findings:      The perianal and digital rectal examinations were normal.      A 3 mm polyp was found in the descending colon. The polyp was sessile.       The polyp was removed with a cold snare. Resection and retrieval were       complete. Estimated blood loss was  minimal.      Multiple small-mouthed diverticula were found in the sigmoid colon.      Internal hemorrhoids were found during retroflexion. The hemorrhoids       were Grade II (internal hemorrhoids that prolapse but reduce       spontaneously).      The exam was otherwise without abnormality on direct and retroflexion       views. Impression:            - One 3 mm polyp in the descending colon, removed with                         a cold snare. Resected and retrieved.                        - Diverticulosis in the sigmoid colon.                        - Internal hemorrhoids.                        - The examination was otherwise normal on direct and                         retroflexion views. Recommendation:        - Discharge patient to home.                        - Resume previous diet.                        - Continue present medications.                        - Await pathology results.                        - Repeat colonoscopy in 7 years for surveillance.                        -  Return to referring physician as previously                         scheduled. Procedure Code(s):     --- Professional ---                        [phone], Colonoscopy, flexible; with removal of                         tumor(s), polyp(s), or other lesion(s) by snare                         technique Diagnosis Code(s):     --- Professional ---                        Z86.010, Personal history of colonic polyps                        K63.5, Polyp of colon                        K64.1, Second degree hemorrhoids                        K57.30, Diverticulosis of large intestine without                         perforation or abscess without bleeding CPT copyright 2019 American Medical Association. All rights reserved. The codes documented in this report are preliminary and upon coder review may  be revised to meet current compliance requirements. Andrey Farmer MD, MD 05/25/2022 9:38:25 AM Number of  Addenda: 0 Note Initiated On: 05/25/2022 9:02 AM Scope Withdrawal Time: 0 hours 10 minutes 15 seconds  Total Procedure Duration: 0 hours 13 minutes 22 seconds  Estimated Blood Loss:  Estimated blood loss was minimal.      [facility]

## 2022-05-25 NOTE — Interval H&P Note (Signed)
History and Physical Interval Note:  05/25/2022 9:16 AM  Michael Plants Sr.  has presented today for surgery, with the diagnosis of history adenomatous polyps.  The various methods of treatment have been discussed with the patient and family. After consideration of risks, benefits and other options for treatment, the patient has consented to  Procedure(s): COLONOSCOPY WITH PROPOFOL (N/A) as a surgical intervention.  The patient's history has been reviewed, patient examined, no change in status, stable for surgery.  I have reviewed the patient's chart and labs.  Questions were answered to the patient's satisfaction.     Regis Bill  Ok to proceed with colonoscopy

## 2022-05-25 NOTE — Transfer of Care (Addendum)
Immediate Anesthesia Transfer of Care Note  Patient: Michael Plants Sr.  Procedure(s) Performed: COLONOSCOPY WITH PROPOFOL  Patient Location: PACU and Endoscopy Unit  Anesthesia Type:General  Level of Consciousness: awake  Airway & Oxygen Therapy: Patient Spontanous Breathing  Post-op Assessment: Post -op Vital signs reviewed and stable  Post vital signs: stable  Last Vitals:  Vitals Value Taken Time  BP    Temp    Pulse    Resp    SpO2      Last Pain:  Vitals:   05/25/22 0858  TempSrc: Temporal  PainSc: 0-No pain         Complications: arrhythmia. Plan EKG

## 2022-05-25 NOTE — Anesthesia Preprocedure Evaluation (Signed)
Anesthesia Evaluation  Patient identified by MRN, date of birth, ID band Patient awake    Reviewed: Allergy & Precautions, NPO status , Patient's Chart, lab work & pertinent test results  Airway Mallampati: IV  TM Distance: <3 FB Neck ROM: full  Mouth opening: Limited Mouth Opening  Dental  (+) Teeth Intact   Pulmonary neg pulmonary ROS, former smoker,    Pulmonary exam normal breath sounds clear to auscultation       Cardiovascular Exercise Tolerance: Good negative cardio ROS Normal cardiovascular exam Rhythm:Regular     Neuro/Psych Depression negative neurological ROS  negative psych ROS   GI/Hepatic negative GI ROS, Neg liver ROS,   Endo/Other  negative endocrine ROS  Renal/GU negative Renal ROS  negative genitourinary   Musculoskeletal   Abdominal Normal abdominal exam  (+)   Peds negative pediatric ROS (+)  Hematology negative hematology ROS (+)   Anesthesia Other Findings Past Medical History: No date: Arthritis No date: Closed head injury No date: Depression  Past Surgical History: No date: BACK SURGERY     Comment:  around 2005-2006 No date: FACIAL RECONSTRUCTION SURGERY     Comment:  nose/sinus area No date: FOOT SURGERY  BMI    Body Mass Index: 29.27 kg/m      Reproductive/Obstetrics negative OB ROS                             Anesthesia Physical Anesthesia Plan  ASA: 2  Anesthesia Plan: General   Post-op Pain Management:    Induction: Intravenous  PONV Risk Score and Plan: Propofol infusion and TIVA  Airway Management Planned: Natural Airway  Additional Equipment:   Intra-op Plan:   Post-operative Plan:   Informed Consent: I have reviewed the patients History and Physical, chart, labs and discussed the procedure including the risks, benefits and alternatives for the proposed anesthesia with the patient or authorized representative who has indicated  his/her understanding and acceptance.     Dental Advisory Given  Plan Discussed with: Anesthesiologist, CRNA and Surgeon  Anesthesia Plan Comments:         Anesthesia Quick Evaluation

## 2022-05-26 ENCOUNTER — Encounter: Payer: Self-pay | Admitting: Gastroenterology

## 2022-05-26 LAB — SURGICAL PATHOLOGY
